# Patient Record
Sex: Female | Born: 1955 | Race: White | Hispanic: No | Marital: Married | Smoking: Never smoker
Health system: Southern US, Community
[De-identification: ages and names within clinical notes are randomized; demographics above are authoritative.]

## PROBLEM LIST (undated history)

## (undated) DIAGNOSIS — T8859XA Other complications of anesthesia, initial encounter: Secondary | ICD-10-CM

## (undated) DIAGNOSIS — M1990 Unspecified osteoarthritis, unspecified site: Secondary | ICD-10-CM

## (undated) DIAGNOSIS — Z8489 Family history of other specified conditions: Secondary | ICD-10-CM

## (undated) DIAGNOSIS — T4145XA Adverse effect of unspecified anesthetic, initial encounter: Secondary | ICD-10-CM

## (undated) DIAGNOSIS — Z9889 Other specified postprocedural states: Secondary | ICD-10-CM

## (undated) DIAGNOSIS — R112 Nausea with vomiting, unspecified: Secondary | ICD-10-CM

## (undated) HISTORY — PX: BREAST SURGERY: SHX581

## (undated) HISTORY — PX: EYE SURGERY: SHX253

## (undated) HISTORY — PX: OTHER SURGICAL HISTORY: SHX169

---

## 1999-05-07 ENCOUNTER — Encounter: Admission: RE | Admit: 1999-05-07 | Discharge: 1999-05-07 | Payer: Self-pay | Admitting: Gynecology

## 1999-05-07 ENCOUNTER — Encounter: Payer: Self-pay | Admitting: Gynecology

## 1999-07-16 ENCOUNTER — Ambulatory Visit (HOSPITAL_COMMUNITY): Admission: RE | Admit: 1999-07-16 | Discharge: 1999-07-16 | Payer: Self-pay | Admitting: Orthopaedic Surgery

## 1999-07-16 ENCOUNTER — Encounter: Payer: Self-pay | Admitting: Orthopaedic Surgery

## 2000-10-26 ENCOUNTER — Other Ambulatory Visit: Admission: RE | Admit: 2000-10-26 | Discharge: 2000-10-26 | Payer: Self-pay | Admitting: Gynecology

## 2000-11-03 ENCOUNTER — Encounter: Admission: RE | Admit: 2000-11-03 | Discharge: 2000-11-03 | Payer: Self-pay | Admitting: Gynecology

## 2000-11-03 ENCOUNTER — Encounter: Payer: Self-pay | Admitting: Gynecology

## 2001-10-16 ENCOUNTER — Ambulatory Visit (HOSPITAL_BASED_OUTPATIENT_CLINIC_OR_DEPARTMENT_OTHER): Admission: RE | Admit: 2001-10-16 | Discharge: 2001-10-16 | Payer: Self-pay | Admitting: Otolaryngology

## 2001-10-16 ENCOUNTER — Encounter (INDEPENDENT_AMBULATORY_CARE_PROVIDER_SITE_OTHER): Payer: Self-pay | Admitting: Specialist

## 2001-10-31 ENCOUNTER — Other Ambulatory Visit: Admission: RE | Admit: 2001-10-31 | Discharge: 2001-10-31 | Payer: Self-pay | Admitting: Gynecology

## 2001-11-27 ENCOUNTER — Encounter: Admission: RE | Admit: 2001-11-27 | Discharge: 2001-11-27 | Payer: Self-pay | Admitting: Gynecology

## 2001-11-27 ENCOUNTER — Encounter: Payer: Self-pay | Admitting: Gynecology

## 2001-12-19 ENCOUNTER — Other Ambulatory Visit: Admission: RE | Admit: 2001-12-19 | Discharge: 2001-12-19 | Payer: Self-pay | Admitting: Gynecology

## 2002-08-29 ENCOUNTER — Other Ambulatory Visit: Admission: RE | Admit: 2002-08-29 | Discharge: 2002-08-29 | Payer: Self-pay | Admitting: Gynecology

## 2003-03-25 ENCOUNTER — Encounter: Admission: RE | Admit: 2003-03-25 | Discharge: 2003-03-25 | Payer: Self-pay | Admitting: Gynecology

## 2003-04-02 ENCOUNTER — Other Ambulatory Visit: Admission: RE | Admit: 2003-04-02 | Discharge: 2003-04-02 | Payer: Self-pay | Admitting: Gynecology

## 2003-10-07 ENCOUNTER — Other Ambulatory Visit: Admission: RE | Admit: 2003-10-07 | Discharge: 2003-10-07 | Payer: Self-pay | Admitting: Gynecology

## 2004-09-25 ENCOUNTER — Encounter: Admission: RE | Admit: 2004-09-25 | Discharge: 2004-09-25 | Payer: Self-pay | Admitting: Gynecology

## 2004-11-25 ENCOUNTER — Other Ambulatory Visit: Admission: RE | Admit: 2004-11-25 | Discharge: 2004-11-25 | Payer: Self-pay | Admitting: Gynecology

## 2005-11-02 ENCOUNTER — Encounter: Admission: RE | Admit: 2005-11-02 | Discharge: 2005-11-02 | Payer: Self-pay | Admitting: Gynecology

## 2005-12-13 ENCOUNTER — Other Ambulatory Visit: Admission: RE | Admit: 2005-12-13 | Discharge: 2005-12-13 | Payer: Self-pay | Admitting: Gynecology

## 2006-12-14 ENCOUNTER — Encounter: Admission: RE | Admit: 2006-12-14 | Discharge: 2006-12-14 | Payer: Self-pay | Admitting: Gynecology

## 2006-12-20 ENCOUNTER — Other Ambulatory Visit: Admission: RE | Admit: 2006-12-20 | Discharge: 2006-12-20 | Payer: Self-pay | Admitting: Gynecology

## 2008-01-12 ENCOUNTER — Encounter: Admission: RE | Admit: 2008-01-12 | Discharge: 2008-01-12 | Payer: Self-pay | Admitting: Gynecology

## 2009-11-05 ENCOUNTER — Encounter: Admission: RE | Admit: 2009-11-05 | Discharge: 2009-11-05 | Payer: Self-pay | Admitting: Gynecology

## 2010-07-20 ENCOUNTER — Other Ambulatory Visit: Payer: Self-pay | Admitting: Gastroenterology

## 2010-10-02 NOTE — Op Note (Signed)
[place]  Patient:    Jasmin Randolph, Jasmin Randolph Visit Number: [id number] MRN: [id number]          Service Type: DSU Location: [facility] Attending Physician:  Carlean Purl Dictated by:   Kristine Garbe Ezzard Standing, M.D. Proc. Date: 10/16/01 Admit Date:  10/16/2001                             Operative Report  PREOPERATIVE DIAGNOSIS:  Chronic left ear canal ulceration.  POSTOPERATIVE DIAGNOSIS:  Chronic left ear canal ulceration.  OPERATION PERFORMED:  Excision of left ear canal ulceration.  SURGEON:  Kristine Garbe. Ezzard Standing, M.D.  ANESTHESIA:  Local 1% Xylocaine with 1:100,000 epinephrine.  COMPLICATIONS:  None.  INDICATIONS FOR PROCEDURE:  The patient is a 55 year old female who has had a chronic left ear canal ulcer, now for about two months that has been nonhealing.   She has been treated with several different antibiotics as well as steroid creams as well as antifungal medications.  Had a persistent sore, left ulcer measures approximately 1 cm in size.  She is taken to the operating room at this time for excision of left ear canal ulcer.  DESCRIPTION OF PROCEDURE:  The ear canal was injected with Xylocaine with epinephrine for local anesthetic.  Betadine was placed for antibacterial.  The ulcer was then elliptically excised and sent to pathology in saline as a fresh specimen.  Hemostasis from the excision site was obtained with silver nitrate. Bacitracin ointment was applied.  The patient was subsequently discharged home.  DISPOSITION:  [name] is discharged home later this morning, instructed to apply antibiotic ointment to the excision site daily.  Will have her follow up in my office in 10 to 14 days for recheck.  She will call my office in two to three days for results of the pathology. Dictated by:   Kristine Garbe Ezzard Standing, M.D. Attending Physician:  Carlean Purl DD:  10/16/01 TD:  10/17/01 Job: [id number]

## 2010-12-24 ENCOUNTER — Other Ambulatory Visit: Payer: Self-pay | Admitting: Gynecology

## 2010-12-24 DIAGNOSIS — Z1231 Encounter for screening mammogram for malignant neoplasm of breast: Secondary | ICD-10-CM

## 2011-01-11 ENCOUNTER — Ambulatory Visit
Admission: RE | Admit: 2011-01-11 | Discharge: 2011-01-11 | Disposition: A | Discharge status: Home / Self Care | Payer: 59 | Source: Ambulatory Visit | Attending: Gynecology | Admitting: Gynecology

## 2011-01-11 DIAGNOSIS — Z1231 Encounter for screening mammogram for malignant neoplasm of breast: Secondary | ICD-10-CM

## 2011-01-14 ENCOUNTER — Other Ambulatory Visit: Payer: Self-pay | Admitting: Gynecology

## 2011-01-14 ENCOUNTER — Ambulatory Visit: Payer: Self-pay

## 2011-01-14 DIAGNOSIS — R928 Other abnormal and inconclusive findings on diagnostic imaging of breast: Secondary | ICD-10-CM

## 2011-01-21 ENCOUNTER — Other Ambulatory Visit: Payer: 59

## 2011-01-22 ENCOUNTER — Ambulatory Visit
Admission: RE | Admit: 2011-01-22 | Discharge: 2011-01-22 | Disposition: A | Discharge status: Home / Self Care | Payer: 59 | Source: Ambulatory Visit | Attending: Gynecology | Admitting: Gynecology

## 2011-01-22 DIAGNOSIS — R928 Other abnormal and inconclusive findings on diagnostic imaging of breast: Secondary | ICD-10-CM

## 2011-01-26 ENCOUNTER — Other Ambulatory Visit: Payer: 59

## 2011-05-13 ENCOUNTER — Other Ambulatory Visit: Payer: Self-pay | Admitting: Gynecology

## 2011-12-20 ENCOUNTER — Other Ambulatory Visit: Payer: Self-pay | Admitting: Gynecology

## 2011-12-20 DIAGNOSIS — Z1231 Encounter for screening mammogram for malignant neoplasm of breast: Secondary | ICD-10-CM

## 2011-12-20 DIAGNOSIS — N63 Unspecified lump in unspecified breast: Secondary | ICD-10-CM

## 2011-12-20 DIAGNOSIS — N644 Mastodynia: Secondary | ICD-10-CM

## 2011-12-30 ENCOUNTER — Ambulatory Visit: Payer: 59

## 2011-12-30 ENCOUNTER — Ambulatory Visit
Admission: RE | Admit: 2011-12-30 | Discharge: 2011-12-30 | Disposition: A | Discharge status: Home / Self Care | Payer: BC Managed Care – PPO | Source: Ambulatory Visit | Attending: Gynecology | Admitting: Gynecology

## 2011-12-30 DIAGNOSIS — N63 Unspecified lump in unspecified breast: Secondary | ICD-10-CM

## 2011-12-30 DIAGNOSIS — N644 Mastodynia: Secondary | ICD-10-CM

## 2012-10-02 ENCOUNTER — Other Ambulatory Visit: Payer: Self-pay | Admitting: Gynecology

## 2012-10-02 DIAGNOSIS — Z1231 Encounter for screening mammogram for malignant neoplasm of breast: Secondary | ICD-10-CM

## 2012-10-02 DIAGNOSIS — N959 Unspecified menopausal and perimenopausal disorder: Secondary | ICD-10-CM

## 2013-01-05 ENCOUNTER — Ambulatory Visit
Admission: RE | Admit: 2013-01-05 | Discharge: 2013-01-05 | Disposition: A | Discharge status: Home / Self Care | Payer: BC Managed Care – PPO | Source: Ambulatory Visit | Attending: Gynecology | Admitting: Gynecology

## 2013-01-05 DIAGNOSIS — Z1231 Encounter for screening mammogram for malignant neoplasm of breast: Secondary | ICD-10-CM

## 2013-01-05 DIAGNOSIS — N959 Unspecified menopausal and perimenopausal disorder: Secondary | ICD-10-CM

## 2014-01-28 ENCOUNTER — Other Ambulatory Visit: Payer: Self-pay

## 2014-01-28 DIAGNOSIS — Z1231 Encounter for screening mammogram for malignant neoplasm of breast: Secondary | ICD-10-CM

## 2014-02-22 ENCOUNTER — Ambulatory Visit
Admission: RE | Admit: 2014-02-22 | Discharge: 2014-02-22 | Disposition: A | Discharge status: Home / Self Care | Payer: BC Managed Care – PPO | Source: Ambulatory Visit

## 2014-02-22 DIAGNOSIS — Z1231 Encounter for screening mammogram for malignant neoplasm of breast: Secondary | ICD-10-CM

## 2014-07-22 IMAGING — MG MM DIGITAL SCREENING BILATERAL
9 of 12 series · 9 of 28 positions shown · non-contrast
Comparison: Previous exam(s).

CLINICAL DATA: Screening.

EXAM:
DIGITAL SCREENING BILATERAL MAMMOGRAM WITH CAD
DIGITAL BREAST TOMOSYNTHESIS
Digital breast tomosynthesis images are acquired in two projections.
These images are reviewed in combination with the digital mammogram,
confirming the findings below.

[L MLO]
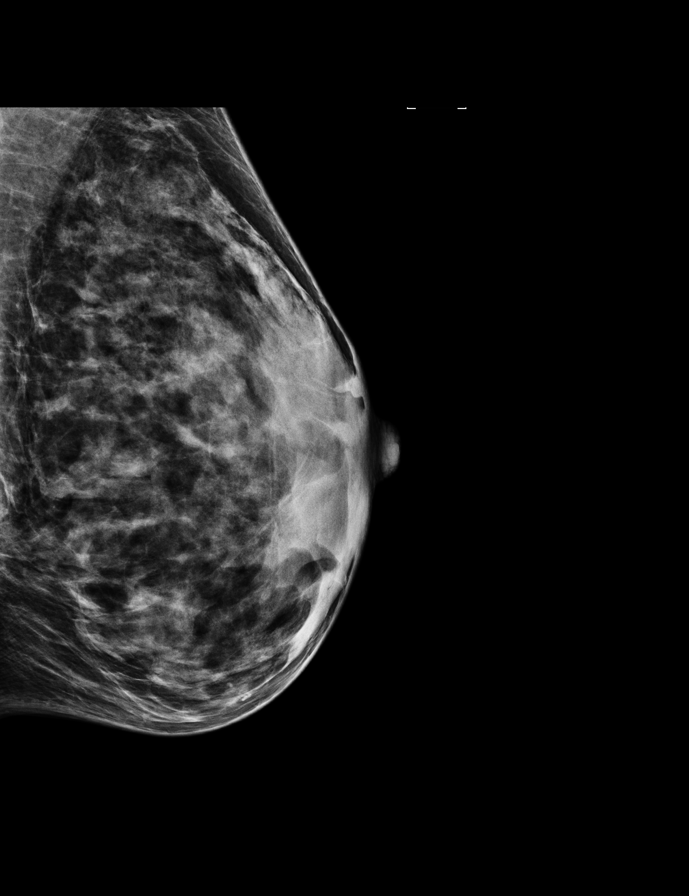

[R CC]
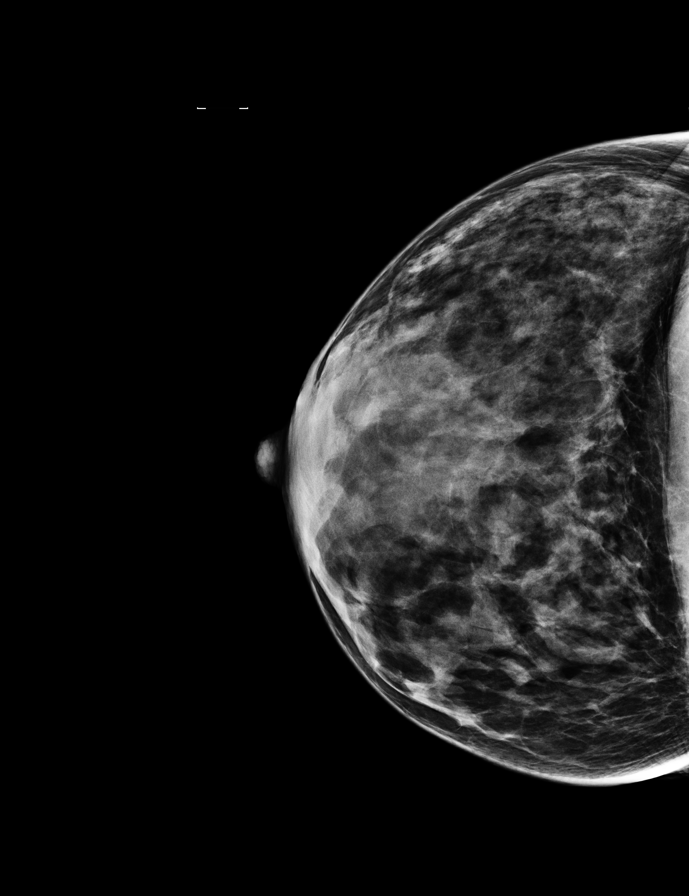

[R MLO]
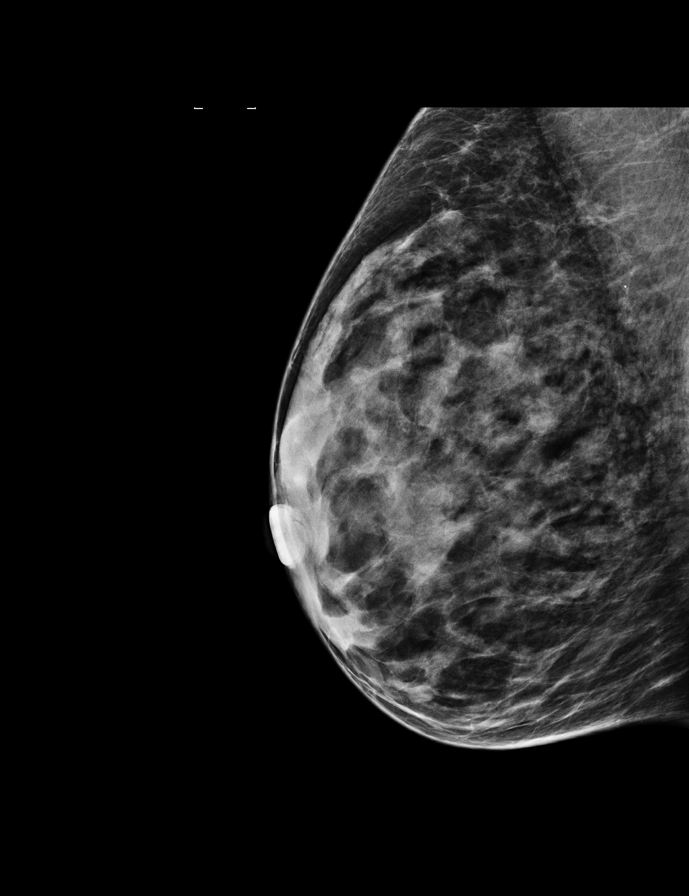

[L CC]
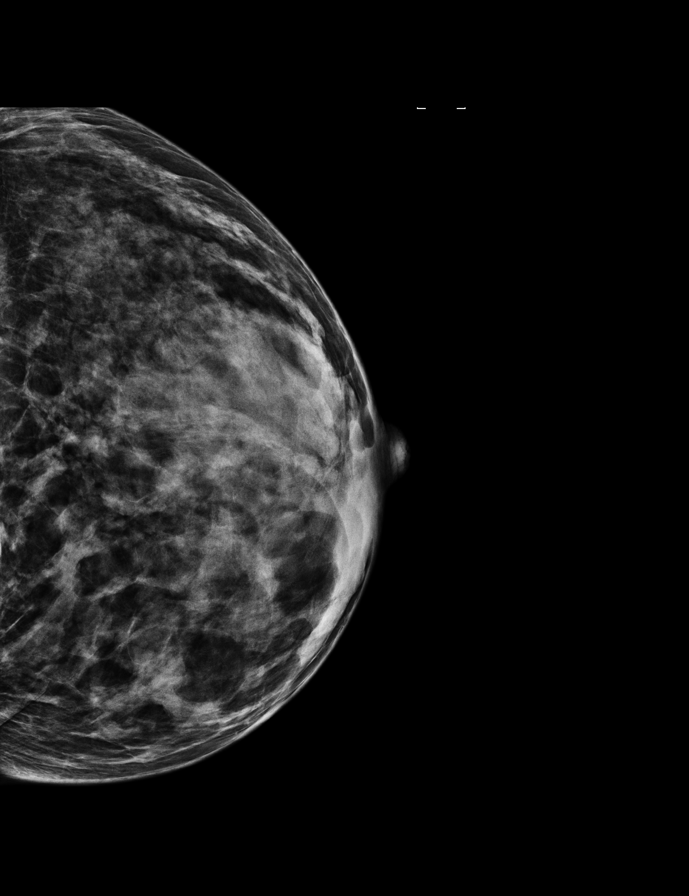

[R MLO tomo · tomo slice 19/36.0]
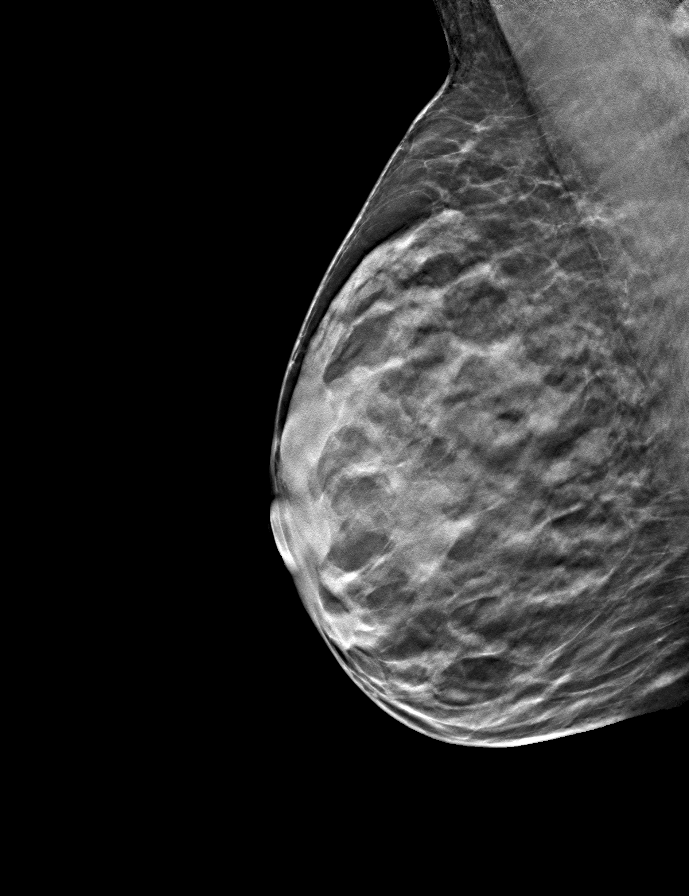

[L CC synth-2D]
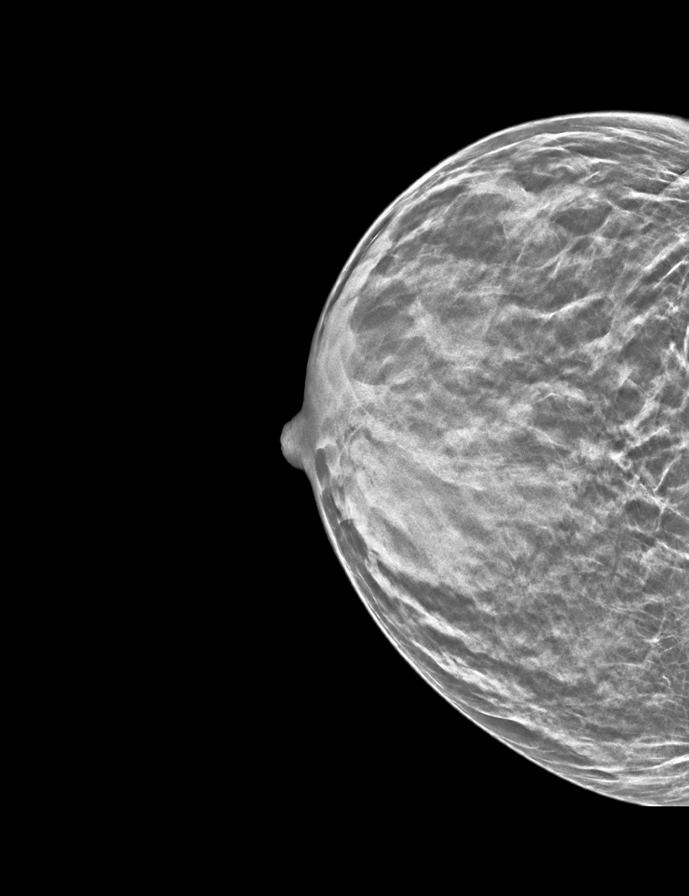

[L MLO synth-2D]
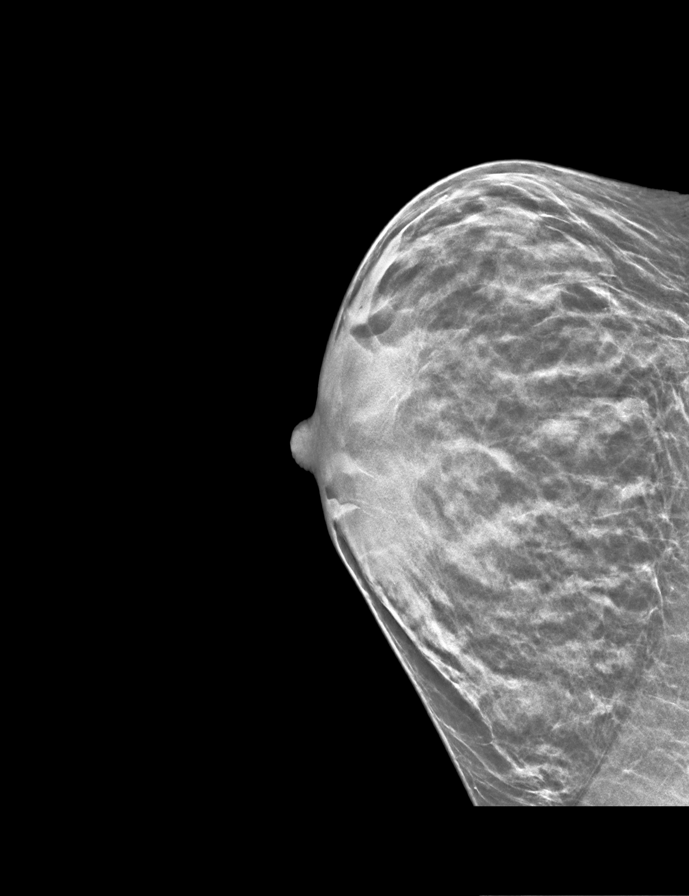

[R MLO synth-2D]
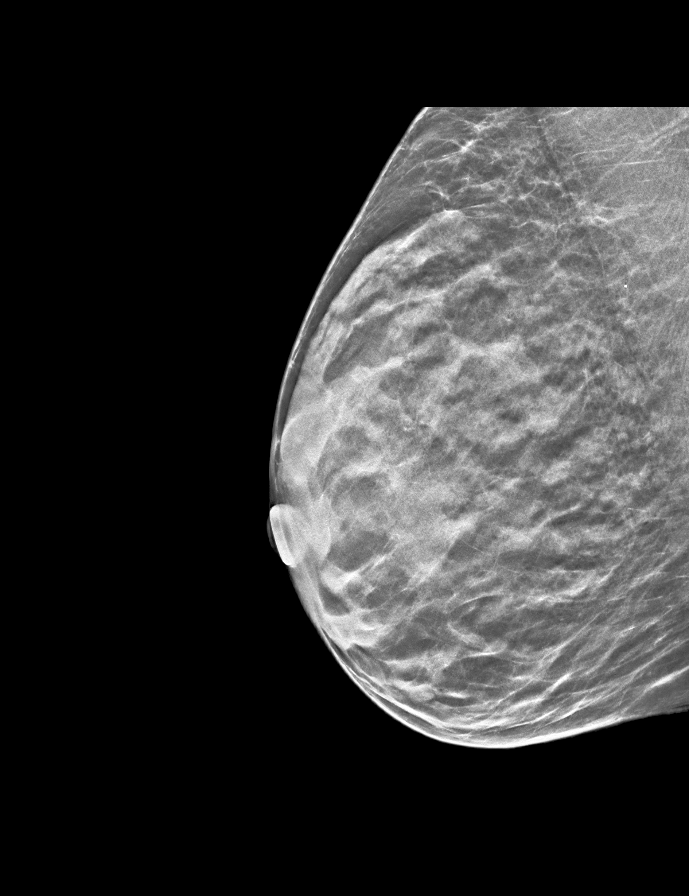

[R CC synth-2D]
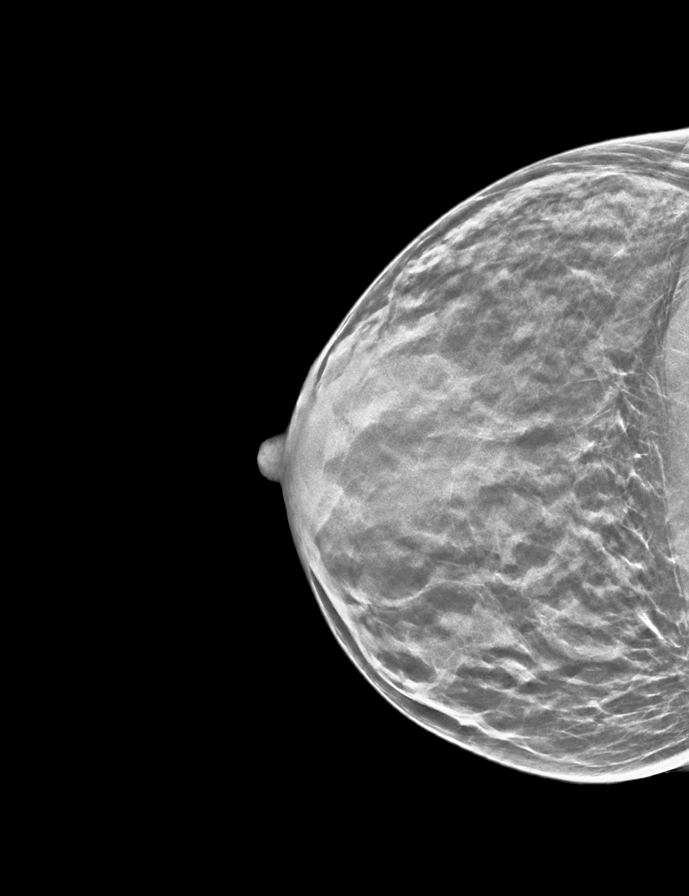

[9 of 28 positions shown; findings below may reference images not displayed]

ACR Breast Density Category c: The breasts are heterogeneously
dense, which may obscure small masses.
FINDINGS: There are no findings suspicious for malignancy. Images were
processed with CAD.
IMPRESSION: No mammographic evidence of malignancy. A result letter of this
screening mammogram will be mailed directly to the patient.

BI-RADS CATEGORY  1. Negative

RECOMMENDATION:
Screening mammogram in one year. (Code:[id number])

## 2016-07-08 ENCOUNTER — Ambulatory Visit: Payer: Self-pay | Admitting: General Surgery

## 2016-07-23 NOTE — Patient Instructions (Addendum)
Jasmin KanarisSarah L Randolph  07/23/2016   Your procedure is scheduled on: Tuesday 07/27/2016  [facility]  Entrance [place]  elevators to 3rd floor to  Short Stay Center at  1130  AM.  Call this number if you have problems the morning of surgery [phone]   Remember: ONLY 1 PERSON MAY GO WITH YOU TO SHORT STAY TO GET  READY MORNING OF YOUR SURGERY.   Do not eat food  :After Midnight.   MAY HAVE CLEAR LIQUIDS FROM MIDNIGHT UP UNTIL 0730 AM THEN NOTHING AFTER 0730 AM UNTIL AFTER SURGERY!     CLEAR LIQUID DIET   Foods Allowed                                                                     Foods Excluded  Coffee and tea, regular and decaf                             liquids that you cannot  Plain Jell-O in any flavor                                             see through such as: Fruit ices (not with fruit pulp)                                     milk, soups, orange juice  Iced Popsicles                                    All solid food Carbonated beverages, regular and diet                                    Cranberry, grape and apple juices Sports drinks like Gatorade Lightly seasoned clear broth or consume(fat free) Sugar, honey syrup  Sample Menu Breakfast                                Lunch                                     Supper Cranberry juice                    Beef broth                            Chicken broth Jell-O                                     Grape  juice                           Apple juice Coffee or tea                        Jell-O                                      Popsicle                                                Coffee or tea                        Coffee or tea  _____________________________________________________________________     Take these medicines the morning of surgery with A SIP OF WATER: none                                You may not have any metal on your body including hair pins and               piercings  Do not wear jewelry, make-up, lotions, powders or perfumes, deodorant             Do not wear nail polish.  Do not shave  48 hours prior to surgery.              Men may shave face and neck.   Do not bring valuables to the hospital. [facility] IS NOT             RESPONSIBLE   FOR VALUABLES.  Contacts, dentures or bridgework may not be worn into surgery.  Leave suitcase in the car. After surgery it may be brought to your room.                  Please read over the following fact sheets you were given: _____________________________________________________________________             [facility] - Preparing for Surgery Before surgery, you can play an important role.  Because skin is not sterile, your skin needs to be as free of germs as possible.  You can reduce the number of germs on your skin by washing with CHG (chlorahexidine gluconate) soap before surgery.  CHG is an antiseptic cleaner which kills germs and bonds with the skin to continue killing germs even after washing. Please DO NOT use if you have an allergy to CHG or antibacterial soaps.  If your skin becomes reddened/irritated stop using the CHG and inform your nurse when you arrive at Short Stay. Do not shave (including legs and underarms) for at least 48 hours prior to the first CHG shower.  You may shave your face/neck. Please follow these instructions carefully:  1.  Shower with CHG Soap the night before surgery and the  morning of Surgery.  2.  If you choose to wash your hair, wash your hair first as usual with your  normal  shampoo.  3.  After you shampoo, rinse your hair and body thoroughly to remove the  shampoo.  4.  Use CHG as you would any other liquid soap.  You can apply chg directly  to the skin and wash                       Gently with a scrungie or clean washcloth.  5.  Apply the CHG Soap to your body ONLY FROM THE NECK DOWN.   Do not use on face/ open                            Wound or open sores. Avoid contact with eyes, ears mouth and genitals (private parts).                       Wash face,  Genitals (private parts) with your normal soap.             6.  Wash thoroughly, paying special attention to the area where your surgery  will be performed.  7.  Thoroughly rinse your body with warm water from the neck down.  8.  DO NOT shower/wash with your normal soap after using and rinsing off  the CHG Soap.                9.  Pat yourself dry with a clean towel.            10.  Wear clean pajamas.            11.  Place clean sheets on your bed the night of your first shower and do not  sleep with pets. Day of Surgery : Do not apply any lotions/deodorants the morning of surgery.  Please wear clean clothes to the hospital/surgery center.  FAILURE TO FOLLOW THESE INSTRUCTIONS MAY RESULT IN THE CANCELLATION OF YOUR SURGERY PATIENT SIGNATURE_________________________________  NURSE SIGNATURE__________________________________  ________________________________________________________________________

## 2016-07-26 ENCOUNTER — Encounter (HOSPITAL_COMMUNITY)
Admission: RE | Admit: 2016-07-26 | Discharge: 2016-07-26 | Disposition: A | Discharge status: Home / Self Care | Payer: BC Managed Care – PPO | Source: Ambulatory Visit | Attending: General Surgery | Admitting: General Surgery

## 2016-07-26 ENCOUNTER — Encounter (INDEPENDENT_AMBULATORY_CARE_PROVIDER_SITE_OTHER): Payer: Self-pay

## 2016-07-26 ENCOUNTER — Encounter (HOSPITAL_COMMUNITY): Payer: Self-pay

## 2016-07-26 HISTORY — DX: Family history of other specified conditions: Z84.89

## 2016-07-26 HISTORY — DX: Unspecified osteoarthritis, unspecified site: M19.90

## 2016-07-26 HISTORY — DX: Nausea with vomiting, unspecified: R11.2

## 2016-07-26 HISTORY — DX: Nausea with vomiting, unspecified: Z98.890

## 2016-07-26 HISTORY — DX: Other complications of anesthesia, initial encounter: T88.59XA

## 2016-07-26 HISTORY — DX: Adverse effect of unspecified anesthetic, initial encounter: T41.45XA

## 2016-07-26 LAB — CBC
HEMATOCRIT: 38.1 % (ref 36.0–46.0)
Hemoglobin: 12.9 g/dL (ref 12.0–15.0)
MCH: 30.4 pg (ref 26.0–34.0)
MCHC: 33.9 g/dL (ref 30.0–36.0)
MCV: 89.6 fL (ref 78.0–100.0)
Platelets: 189 10*3/uL (ref 150–400)
RBC: 4.25 MIL/uL (ref 3.87–5.11)
RDW: 13.2 % (ref 11.5–15.5)
WBC: 4.8 10*3/uL (ref 4.0–10.5)

## 2016-07-26 LAB — ABO/RH: ABO/RH(D): O NEG

## 2016-07-27 ENCOUNTER — Inpatient Hospital Stay (HOSPITAL_COMMUNITY)
Admission: RE | Admit: 2016-07-27 | Discharge: 2016-07-29 | DRG: 801 | Disposition: A | Discharge status: Home / Self Care | Payer: BC Managed Care – PPO | Source: Ambulatory Visit | Attending: General Surgery | Admitting: General Surgery

## 2016-07-27 ENCOUNTER — Inpatient Hospital Stay (HOSPITAL_COMMUNITY): Payer: BC Managed Care – PPO | Admitting: Certified Registered Nurse Anesthetist

## 2016-07-27 ENCOUNTER — Encounter (HOSPITAL_COMMUNITY): Payer: Self-pay | Admitting: *Deleted

## 2016-07-27 ENCOUNTER — Encounter (HOSPITAL_COMMUNITY)
Admission: RE | Disposition: A | Discharge status: Home / Self Care | Payer: Self-pay | Source: Ambulatory Visit | Attending: General Surgery

## 2016-07-27 DIAGNOSIS — K660 Peritoneal adhesions (postprocedural) (postinfection): Secondary | ICD-10-CM | POA: Diagnosis present

## 2016-07-27 DIAGNOSIS — D734 Cyst of spleen: Principal | ICD-10-CM | POA: Diagnosis present

## 2016-07-27 DIAGNOSIS — Z86010 Personal history of colonic polyps: Secondary | ICD-10-CM | POA: Diagnosis not present

## 2016-07-27 HISTORY — PX: LAPAROSCOPIC SPLENECTOMY: SHX409

## 2016-07-27 LAB — TYPE AND SCREEN
ABO/RH(D): O NEG
Antibody Screen: NEGATIVE

## 2016-07-27 SURGERY — SPLENECTOMY, LAPAROSCOPIC
Anesthesia: General | Site: Abdomen

## 2016-07-27 MED ORDER — FENTANYL CITRATE (PF) 100 MCG/2ML IJ SOLN
INTRAMUSCULAR | Status: DC | PRN
  Administered 2016-07-27 (×5): 50 ug via INTRAVENOUS

## 2016-07-27 MED ORDER — BUPIVACAINE-EPINEPHRINE 0.25% -1:200000 IJ SOLN
INTRAMUSCULAR | Status: AC
  Filled 2016-07-27: qty 1

## 2016-07-27 MED ORDER — BUPIVACAINE LIPOSOME 1.3 % IJ SUSP
20.0000 mL | Freq: Once | INTRAMUSCULAR | Status: DC
  Filled 2016-07-27: qty 20

## 2016-07-27 MED ORDER — BUPIVACAINE-EPINEPHRINE 0.25% -1:200000 IJ SOLN
INTRAMUSCULAR | Status: DC | PRN
  Administered 2016-07-27: 38 mL

## 2016-07-27 MED ORDER — OXYCODONE-ACETAMINOPHEN 5-325 MG PO TABS
1.0000 | ORAL_TABLET | ORAL | Status: DC | PRN
  Administered 2016-07-28 – 2016-07-29 (×3): 1 via ORAL
  Filled 2016-07-27 (×3): qty 1

## 2016-07-27 MED ORDER — ROCURONIUM BROMIDE 50 MG/5ML IV SOSY
PREFILLED_SYRINGE | INTRAVENOUS | Status: AC
  Filled 2016-07-27: qty 5

## 2016-07-27 MED ORDER — CEFAZOLIN SODIUM-DEXTROSE 2-4 GM/100ML-% IV SOLN
2.0000 g | INTRAVENOUS | Status: AC
  Administered 2016-07-27 (×2): 2 g via INTRAVENOUS
  Filled 2016-07-27: qty 100

## 2016-07-27 MED ORDER — CEFAZOLIN SODIUM-DEXTROSE 2-4 GM/100ML-% IV SOLN
INTRAVENOUS | Status: AC
  Filled 2016-07-27: qty 100

## 2016-07-27 MED ORDER — MIDAZOLAM HCL 5 MG/5ML IJ SOLN
INTRAMUSCULAR | Status: DC | PRN
  Administered 2016-07-27 (×2): 1 mg via INTRAVENOUS

## 2016-07-27 MED ORDER — PANTOPRAZOLE SODIUM 40 MG IV SOLR
40.0000 mg | Freq: Every day | INTRAVENOUS | Status: DC
  Administered 2016-07-27: 40 mg via INTRAVENOUS
  Filled 2016-07-27: qty 40

## 2016-07-27 MED ORDER — ONDANSETRON HCL 4 MG/2ML IJ SOLN
INTRAMUSCULAR | Status: AC
  Filled 2016-07-27: qty 2

## 2016-07-27 MED ORDER — ONDANSETRON 4 MG PO TBDP: 4.0000 mg | ORAL_TABLET | Freq: Four times a day (QID) | ORAL | Status: DC | PRN

## 2016-07-27 MED ORDER — ACETAMINOPHEN 500 MG PO TABS
1000.0000 mg | ORAL_TABLET | ORAL | Status: AC
  Administered 2016-07-27: 1000 mg via ORAL
  Filled 2016-07-27: qty 2

## 2016-07-27 MED ORDER — 0.9 % SODIUM CHLORIDE (POUR BTL) OPTIME
TOPICAL | Status: DC | PRN
  Administered 2016-07-27: 1000 mL

## 2016-07-27 MED ORDER — MEPERIDINE HCL 50 MG/ML IJ SOLN: 6.2500 mg | INTRAMUSCULAR | Status: DC | PRN

## 2016-07-27 MED ORDER — PROPOFOL 10 MG/ML IV BOLUS
INTRAVENOUS | Status: AC
  Filled 2016-07-27: qty 20

## 2016-07-27 MED ORDER — LACTATED RINGERS IV SOLN
INTRAVENOUS | Status: DC
  Administered 2016-07-27 (×3): via INTRAVENOUS

## 2016-07-27 MED ORDER — DEXAMETHASONE SODIUM PHOSPHATE 10 MG/ML IJ SOLN
INTRAMUSCULAR | Status: DC | PRN
  Administered 2016-07-27: 10 mg via INTRAVENOUS

## 2016-07-27 MED ORDER — PROMETHAZINE HCL 25 MG/ML IJ SOLN: 6.2500 mg | INTRAMUSCULAR | Status: DC | PRN

## 2016-07-27 MED ORDER — ONDANSETRON HCL 4 MG/2ML IJ SOLN: 4.0000 mg | Freq: Four times a day (QID) | INTRAMUSCULAR | Status: DC | PRN

## 2016-07-27 MED ORDER — ROCURONIUM BROMIDE 50 MG/5ML IV SOSY
PREFILLED_SYRINGE | INTRAVENOUS | Status: AC
  Filled 2016-07-27: qty 15

## 2016-07-27 MED ORDER — SUGAMMADEX SODIUM 200 MG/2ML IV SOLN
INTRAVENOUS | Status: DC | PRN
  Administered 2016-07-27: 200 mg via INTRAVENOUS

## 2016-07-27 MED ORDER — CELECOXIB 200 MG PO CAPS
400.0000 mg | ORAL_CAPSULE | ORAL | Status: AC
Start: 2016-07-27 — End: 2016-07-27
  Administered 2016-07-27: 400 mg via ORAL
  Filled 2016-07-27: qty 2

## 2016-07-27 MED ORDER — HYDROMORPHONE HCL 2 MG/ML IJ SOLN
INTRAMUSCULAR | Status: AC
  Filled 2016-07-27: qty 1

## 2016-07-27 MED ORDER — CHLORHEXIDINE GLUCONATE CLOTH 2 % EX PADS: 6.0000 | MEDICATED_PAD | Freq: Once | CUTANEOUS | Status: DC

## 2016-07-27 MED ORDER — FENTANYL CITRATE (PF) 250 MCG/5ML IJ SOLN
INTRAMUSCULAR | Status: AC
  Filled 2016-07-27: qty 5

## 2016-07-27 MED ORDER — ONDANSETRON HCL 4 MG/2ML IJ SOLN
INTRAMUSCULAR | Status: DC | PRN
  Administered 2016-07-27: 4 mg via INTRAVENOUS

## 2016-07-27 MED ORDER — SCOPOLAMINE 1 MG/3DAYS TD PT72
MEDICATED_PATCH | TRANSDERMAL | Status: AC
  Filled 2016-07-27: qty 1

## 2016-07-27 MED ORDER — MIDAZOLAM HCL 2 MG/2ML IJ SOLN
INTRAMUSCULAR | Status: AC
  Filled 2016-07-27: qty 2

## 2016-07-27 MED ORDER — PROPOFOL 10 MG/ML IV BOLUS
INTRAVENOUS | Status: DC | PRN
  Administered 2016-07-27: 150 mg via INTRAVENOUS
  Administered 2016-07-27: 50 mg via INTRAVENOUS

## 2016-07-27 MED ORDER — MORPHINE SULFATE (PF) 4 MG/ML IV SOLN: 2.0000 mg | INTRAVENOUS | Status: DC | PRN

## 2016-07-27 MED ORDER — EVICEL 5 ML EX KIT
PACK | Freq: Once | CUTANEOUS | Status: DC
  Filled 2016-07-27: qty 1

## 2016-07-27 MED ORDER — LIDOCAINE 2% (20 MG/ML) 5 ML SYRINGE
INTRAMUSCULAR | Status: AC
  Filled 2016-07-27: qty 5

## 2016-07-27 MED ORDER — KCL IN DEXTROSE-NACL 20-5-0.9 MEQ/L-%-% IV SOLN
INTRAVENOUS | Status: DC
  Administered 2016-07-27 – 2016-07-28 (×2): via INTRAVENOUS
  Filled 2016-07-27 (×5): qty 1000

## 2016-07-27 MED ORDER — ENOXAPARIN SODIUM 40 MG/0.4ML ~~LOC~~ SOLN
40.0000 mg | SUBCUTANEOUS | Status: DC
  Administered 2016-07-28: 40 mg via SUBCUTANEOUS
  Filled 2016-07-27: qty 0.4

## 2016-07-27 MED ORDER — MIDAZOLAM HCL 2 MG/2ML IJ SOLN: 0.5000 mg | Freq: Once | INTRAMUSCULAR | Status: DC | PRN

## 2016-07-27 MED ORDER — LIDOCAINE 2% (20 MG/ML) 5 ML SYRINGE
INTRAMUSCULAR | Status: DC | PRN
  Administered 2016-07-27: 50 mg via INTRAVENOUS

## 2016-07-27 MED ORDER — GABAPENTIN 300 MG PO CAPS
300.0000 mg | ORAL_CAPSULE | ORAL | Status: AC
  Administered 2016-07-27: 300 mg via ORAL
  Filled 2016-07-27: qty 1

## 2016-07-27 MED ORDER — LACTATED RINGERS IR SOLN
Status: DC | PRN
  Administered 2016-07-27: 1000 mL

## 2016-07-27 MED ORDER — HYDROMORPHONE HCL 1 MG/ML IJ SOLN
0.2500 mg | INTRAMUSCULAR | Status: DC | PRN
  Administered 2016-07-27 (×2): 0.5 mg via INTRAVENOUS

## 2016-07-27 MED ORDER — HYDROMORPHONE HCL 1 MG/ML IJ SOLN
INTRAMUSCULAR | Status: AC
  Administered 2016-07-27: 0.5 mg via INTRAVENOUS
  Filled 2016-07-27: qty 1

## 2016-07-27 MED ORDER — ROCURONIUM BROMIDE 50 MG/5ML IV SOSY
PREFILLED_SYRINGE | INTRAVENOUS | Status: DC | PRN
  Administered 2016-07-27: 20 mg via INTRAVENOUS
  Administered 2016-07-27: 10 mg via INTRAVENOUS
  Administered 2016-07-27 (×3): 20 mg via INTRAVENOUS
  Administered 2016-07-27: 40 mg via INTRAVENOUS

## 2016-07-27 MED ORDER — SCOPOLAMINE 1 MG/3DAYS TD PT72
1.0000 | MEDICATED_PATCH | TRANSDERMAL | Status: DC
  Administered 2016-07-27: 1 via TRANSDERMAL

## 2016-07-27 MED ORDER — DEXAMETHASONE SODIUM PHOSPHATE 10 MG/ML IJ SOLN
INTRAMUSCULAR | Status: AC
  Filled 2016-07-27: qty 1

## 2016-07-27 SURGICAL SUPPLY — 85 items
ADH SKN CLS APL DERMABOND .7 (GAUZE/BANDAGES/DRESSINGS) ×1
APL SRG 32X5 SNPLK LF DISP (MISCELLANEOUS)
APPLIER CLIP ROT 13.4 12 LRG (CLIP)
APR CLP LRG 13.4X12 ROT 20 MLT (CLIP)
BAG LAPAROSCOPIC 12 15 PORT 16 (BASKET) IMPLANT
BAG RETRIEVAL 12/15 (BASKET)
BAG RETRIEVAL 12/15MM (BASKET)
BAG SPEC RTRVL LRG 6X4 10 (ENDOMECHANICALS) ×1
BIOPATCH WHT 1IN DISK W/4.0 H (GAUZE/BANDAGES/DRESSINGS) IMPLANT
BLADE SURG 15 STRL LF DISP TIS (BLADE) ×1 IMPLANT
BLADE SURG 15 STRL SS (BLADE) ×3
CHLORAPREP W/TINT 26ML (MISCELLANEOUS) ×3 IMPLANT
CLIP APPLIE ROT 13.4 12 LRG (CLIP) IMPLANT
CLIP SUT LAPRA TY ABSORB (SUTURE) IMPLANT
CLOSURE WOUND 1/2 X4 (GAUZE/BANDAGES/DRESSINGS)
COVER SURGICAL LIGHT HANDLE (MISCELLANEOUS) ×3 IMPLANT
DECANTER SPIKE VIAL GLASS SM (MISCELLANEOUS) ×3 IMPLANT
DERMABOND ADVANCED (GAUZE/BANDAGES/DRESSINGS) ×2
DERMABOND ADVANCED .7 DNX12 (GAUZE/BANDAGES/DRESSINGS) IMPLANT
DEVICE SUTURE ENDOST 10MM (ENDOMECHANICALS) IMPLANT
DISSECTOR BLUNT TIP ENDO 5MM (MISCELLANEOUS) IMPLANT
DRAIN CHANNEL 19F RND (DRAIN) ×2 IMPLANT
DRAIN PENROSE 18X1/4 LTX STRL (WOUND CARE) IMPLANT
DRSG PAD ABDOMINAL 8X10 ST (GAUZE/BANDAGES/DRESSINGS) IMPLANT
ELECT PENCIL ROCKER SW 15FT (MISCELLANEOUS) ×1 IMPLANT
ELECT REM PT RETURN 9FT ADLT (ELECTROSURGICAL) ×3
ELECTRODE REM PT RTRN 9FT ADLT (ELECTROSURGICAL) ×1 IMPLANT
EVACUATOR SILICONE 100CC (DRAIN) ×2 IMPLANT
GAUZE SPONGE 4X4 12PLY STRL (GAUZE/BANDAGES/DRESSINGS) ×3 IMPLANT
GAUZE SPONGE 4X4 16PLY XRAY LF (GAUZE/BANDAGES/DRESSINGS) ×1 IMPLANT
GLOVE BIOGEL PI IND STRL 7.5 (GLOVE) ×1 IMPLANT
GLOVE BIOGEL PI INDICATOR 7.5 (GLOVE) ×2
GLOVE ECLIPSE 7.5 STRL STRAW (GLOVE) ×3 IMPLANT
GOWN STRL REUS W/TWL XL LVL3 (GOWN DISPOSABLE) ×6 IMPLANT
HEMOSTAT ARISTA ABSORB 3G PWDR (MISCELLANEOUS) IMPLANT
HEMOSTAT SURGICEL 4X8 (HEMOSTASIS) IMPLANT
HOLDER FOLEY CATH W/STRAP (MISCELLANEOUS) ×2 IMPLANT
IRRIG SUCT STRYKERFLOW 2 WTIP (MISCELLANEOUS) ×3
IRRIGATION SUCT STRKRFLW 2 WTP (MISCELLANEOUS) ×1 IMPLANT
KIT BASIN OR (CUSTOM PROCEDURE TRAY) ×3 IMPLANT
NEEDLE HYPO 22GX1.5 SAFETY (NEEDLE) ×3 IMPLANT
PACK CARDIOVASCULAR III (CUSTOM PROCEDURE TRAY) ×3 IMPLANT
PAD POSITIONING PINK XL (MISCELLANEOUS) IMPLANT
POUCH SPECIMEN RETRIEVAL 10MM (ENDOMECHANICALS) ×2 IMPLANT
RELOAD ENDO STITCH 2.0 (ENDOMECHANICALS)
RELOAD STAPLE 60 2.6 WHT THN (STAPLE) IMPLANT
RELOAD STAPLE 60 3.6 BLU REG (STAPLE) IMPLANT
RELOAD STAPLER BLUE 60MM (STAPLE) IMPLANT
RELOAD STAPLER WHITE 60MM (STAPLE) IMPLANT
RELOAD SUT SNGL STCH ABSRB 2-0 (ENDOMECHANICALS) IMPLANT
RELOAD SUT SNGL STCH BLK 2-0 (ENDOMECHANICALS) IMPLANT
SCISSORS LAP 5X35 DISP (ENDOMECHANICALS) ×3 IMPLANT
SEALANT SURGICAL APPL DUAL CAN (MISCELLANEOUS) ×1 IMPLANT
SET IRRIG TUBING LAPAROSCOPIC (IRRIGATION / IRRIGATOR) ×2 IMPLANT
SHEARS HARMONIC ACE PLUS 36CM (ENDOMECHANICALS) ×3 IMPLANT
SLEEVE XCEL OPT CAN 5 100 (ENDOMECHANICALS) IMPLANT
SOLUTION ANTI FOG 6CC (MISCELLANEOUS) ×3 IMPLANT
SPONGE LAP 18X18 X RAY DECT (DISPOSABLE) IMPLANT
STAPLER ECHELON LONG 60 440 (INSTRUMENTS) IMPLANT
STAPLER RELOAD BLUE 60MM (STAPLE)
STAPLER RELOAD WHITE 60MM (STAPLE)
STAPLER VISISTAT 35W (STAPLE) ×1 IMPLANT
STRIP CLOSURE SKIN 1/2X4 (GAUZE/BANDAGES/DRESSINGS) IMPLANT
SUT ETHILON 3 0 PS 1 (SUTURE) ×3 IMPLANT
SUT MNCRL AB 4-0 PS2 18 (SUTURE) ×5 IMPLANT
SUT PDS AB 1 TP1 96 (SUTURE) IMPLANT
SUT RELOAD ENDO STITCH 2 48X1 (ENDOMECHANICALS)
SUT RELOAD ENDO STITCH 2.0 (ENDOMECHANICALS)
SUT VIC AB 4-0 SH 18 (SUTURE) ×1 IMPLANT
SUT VICRYL 0 UR6 27IN ABS (SUTURE) ×6 IMPLANT
SUTURE RELOAD END STTCH 2 48X1 (ENDOMECHANICALS) IMPLANT
SUTURE RELOAD ENDO STITCH 2.0 (ENDOMECHANICALS) IMPLANT
SYR 20CC LL (SYRINGE) ×1 IMPLANT
TIP RIGID 35CM EVICEL (HEMOSTASIS) IMPLANT
TOWEL OR 17X26 10 PK STRL BLUE (TOWEL DISPOSABLE) ×3 IMPLANT
TOWEL OR NON WOVEN STRL DISP B (DISPOSABLE) ×3 IMPLANT
TRAY FOLEY CATH 14FRSI W/METER (CATHETERS) ×2 IMPLANT
TRAY FOLEY W/METER SILVER 16FR (SET/KITS/TRAYS/PACK) ×1 IMPLANT
TROCAR ADV FIXATION 5X100MM (TROCAR) ×2 IMPLANT
TROCAR BLADELESS OPT 5 100 (ENDOMECHANICALS) ×3 IMPLANT
TROCAR XCEL 12X100 BLDLESS (ENDOMECHANICALS) IMPLANT
TROCAR XCEL BLUNT TIP 100MML (ENDOMECHANICALS) IMPLANT
TROCAR XCEL NON-BLD 11X100MML (ENDOMECHANICALS) IMPLANT
TUBING INSUF HEATED (TUBING) ×3 IMPLANT
YANKAUER SUCT BULB TIP 10FT TU (MISCELLANEOUS) ×1 IMPLANT

## 2016-07-27 NOTE — Anesthesia Preprocedure Evaluation (Signed)
Anesthesia Evaluation  Patient identified by MRN, date of birth, ID band Patient awake    Reviewed: Allergy & Precautions, NPO status , Patient's Chart, lab work & pertinent test results  History of Anesthesia Complications (+) PONV  Airway Mallampati: I  TM Distance: >3 FB Neck ROM: Full    Dental  (+) Dental Advisory Given, Teeth Intact   Pulmonary neg pulmonary ROS,    breath sounds clear to auscultation       Cardiovascular negative cardio ROS   Rhythm:Regular Rate:Normal     Neuro/Psych negative neurological ROS     GI/Hepatic negative GI ROS, Neg liver ROS,   Endo/Other  negative endocrine ROS  Renal/GU negative Renal ROS     Musculoskeletal negative musculoskeletal ROS (+)   Abdominal   Peds  Hematology negative hematology ROS (+)   Anesthesia Other Findings   Reproductive/Obstetrics                             Anesthesia Physical Anesthesia Plan  ASA: I  Anesthesia Plan: General   Post-op Pain Management:    Induction: Intravenous  Airway Management Planned: Oral ETT  Additional Equipment:   Intra-op Plan:   Post-operative Plan: Extubation in OR  Informed Consent: I have reviewed the patients History and Physical, chart, labs and discussed the procedure including the risks, benefits and alternatives for the proposed anesthesia with the patient or authorized representative who has indicated his/her understanding and acceptance.   Dental advisory given  Plan Discussed with: CRNA and Surgeon  Anesthesia Plan Comments: (Plan routine monitors, GETA)        Anesthesia Quick Evaluation

## 2016-07-27 NOTE — Anesthesia Procedure Notes (Signed)
Procedure Name: Intubation Date/Time: 07/27/2016 2:21 PM Performed by: West Pugh Pre-anesthesia Checklist: Patient identified, Emergency Drugs available, Suction available, Patient being monitored and Timeout performed Patient Re-evaluated:Patient Re-evaluated prior to inductionOxygen Delivery Method: Circle system utilized Preoxygenation: Pre-oxygenation with 100% oxygen Intubation Type: IV induction and Cricoid Pressure applied Ventilation: Mask ventilation without difficulty Laryngoscope Size: Mac and 4 Grade View: Grade II Tube type: Oral Tube size: 7.5 mm Number of attempts: 1 Airway Equipment and Method: Stylet Placement Confirmation: ETT inserted through vocal cords under direct vision,  positive ETCO2,  CO2 detector and breath sounds checked- equal and bilateral Secured at: 22 cm Tube secured with: Tape Dental Injury: Teeth and Oropharynx as per pre-operative assessment

## 2016-07-27 NOTE — Interval H&P Note (Signed)
History and Physical Interval Note:  07/27/2016 2:12 PM  Jasmin KanarisSarah L Randolph  has presented today for surgery, with the diagnosis of splenic cyst  The various methods of treatment have been discussed with the patient and family. After consideration of risks, benefits and other options for treatment, the patient has consented to  Procedure(s): LAPAROSCOPIC  EXCISION SPLENIC CYST (N/A) as a surgical intervention .  The patient's history has been reviewed, patient examined, no change in status, stable for surgery.  I have reviewed the patient's chart and labs.  Questions were answered to the patient's satisfaction.     Nariya Neumeyer T

## 2016-07-27 NOTE — H&P (Signed)
History of Present Illness Jasmin Salina T. Henley Blyth MD; 07/07/2016 4:08 PM) The patient is a 61 year old female who presents with a splenic disorder. Patient is a pleasant 61 year old female who retired to [employer] about 9 years ago and previously resided in [place]. She still gets some of her medical care here. She about 2 months ago began to notice a vague discomfort in her left mid abdomen to left lower quadrant. She is an avid runner and fitness buff. She denies any significant pain but more just a feeling of pressure or discomfort. This is actually somewhat better and now extremely minimal over the last couple of weeks. Due to this symptom and particularly with a history of DES use by her mother she ultimately underwent a CT scan of the abdomen. I have the disc and have reviewed this and this shows a 16 x 15 x 14 cm simple appearing cyst arising from the spleen and appears to be projecting posteriorly and superiorly beneath the diaphragm and in the lesser sac with significant mass effect on upper abdominal organs including the stomach and pancreas adrenal gland kidney and bowel loops. She denies any history of previous abdominal trauma. No previous abdominal surgery. No history of GI or parasitic infection known.   Past Surgical History Timmothy Euler, [place]; 07/07/2016 2:42 PM) Colon Polyp Removal - Colonoscopy  Tonsillectomy   Diagnostic Studies History Timmothy Euler, [place]; 07/07/2016 2:42 PM) Colonoscopy  5-10 years ago Mammogram  1-3 years ago  Allergies Timmothy Euler, [place]; 07/07/2016 2:43 PM) No Known Drug Allergies 07/07/2016 Allergies Reconciled   Medication History Timmothy Euler, [place]; 07/07/2016 2:43 PM) No Current Medications Medications Reconciled  Social History Timmothy Euler, [place]; 07/07/2016 2:42 PM) Alcohol use  Occasional alcohol use. Caffeine use  Coffee. No drug use  Tobacco use  Never smoker.  Family History Timmothy Euler, [place];  07/07/2016 2:42 PM) First Degree Relatives  No pertinent family history   Pregnancy / Birth History Timmothy Euler, [place]; 07/07/2016 2:42 PM) Age at menarche  11 years. Age of menopause  20-55 Gravida  0 Para  0  Other Problems Timmothy Euler, [place]; 07/07/2016 2:42 PM) Other disease, cancer, significant illness     Review of Systems Timmothy Euler CMA; 07/07/2016 2:42 PM) Gastrointestinal Present- Abdominal Pain. Not Present- Bloating, Bloody Stool, Change in Bowel Habits, Chronic diarrhea, Constipation, Difficulty Swallowing, Excessive gas, Gets full quickly at meals, Hemorrhoids, Indigestion, Nausea, Rectal Pain and Vomiting.  Vitals Barron Alvine Bradford CMA; 07/07/2016 2:43 PM) 07/07/2016 2:43 PM Weight: 150 lb Height: 69.5in Body Surface Area: 1.84 m Body Mass Index: 21.83 kg/m  Temp.: 98.7F  Pulse: 60 (Regular)  BP: 142/88 (Sitting, Left Arm, Standard)       Physical Exam Jasmin Salina T. Katyra Tomassetti MD; 07/07/2016 4:09 PM) The physical exam findings are as follows: Note:General: Alert, thin healthy-appearing Caucasian female, in no distress Skin: Warm and dry without rash or infection. HEENT: No palpable masses or thyromegaly. Sclera nonicteric. Pupils equal round and reactive. Lymph nodes: No cervical, supraclavicular, or inguinal nodes palpable. Lungs: Breath sounds clear and equal. No wheezing or increased work of breathing. Cardiovascular: Regular rate and rhythm without murmer. No JVD or edema. Peripheral pulses intact. No carotid bruits. Abdomen: Nondistended. Soft and nontender. She is thin and with inspiration there is a palpable fullness or soft mass in the left upper quadrant. No palpable hernias. Extremities: No edema or joint swelling or deformity. No chronic venous stasis changes. Neurologic: Alert and fully oriented. Gait normal. No focal weakness.  Psychiatric: Normal mood and affect. Thought content appropriate with normal judgement and  insight    Assessment & Plan Jasmin Salina(Merrit Friesen T. 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 This likely is an epidermoid or possibly post traumatic cyst. Much less likely hidatid cyst based on appearance and history. We will check an Echinococcus serology which I expect will be negative. She has no eosinophilia. I discussed with the patient and her husband that there is very little literature regarding management of splenic cysts and no evidence based data on their management. This is a very large cyst and I think would be at some significant risk for rupture with bleeding or infection. I discussed options with the patient. Surgical options would include unroofing or marsupialization, partial splenectomy with complete excision of the cyst or total splenectomy. I would try to avoid total splenectomy if at all possible. If I were to approach this laparoscopically I would plan excision of his much cyst wall as possible and if anatomically amenable consider partial splenectomy or cauterization or ablation of the cyst wall on the spleen. I discussed that this is a very rare problem and offered a second opinion at a local Medical Center which they declined. I discussed the nature of the surgery with risks of anesthetic complications, conversion to open procedure, bleeding, infection and fairly high risk of recurrent cyst with simple marsupialization. All their questions were answered. We will check the Echinococcus serology and proceed with scheduling. Current Plans Laparoscopic and possible open excision of splenic cyst

## 2016-07-27 NOTE — Transfer of Care (Signed)
Immediate Anesthesia Transfer of Care Note  Patient: Jasmin KanarisSarah L Randolph  Procedure(s) Performed: Procedure(s): LAPAROSCOPIC  EXCISION SPLENIC CYST (N/A)  Patient Location: PACU  Anesthesia Type:General  Level of Consciousness:  sedated, patient cooperative and responds to stimulation  Airway & Oxygen Therapy:Patient Spontanous Breathing and Patient connected to face mask oxgen  Post-op Assessment:  Report given to PACU RN and Post -op Vital signs reviewed and stable  Post vital signs:  Reviewed and stable  Last Vitals:  Vitals:   07/27/16 1147  BP: 127/78  Pulse: 71  Resp: 18  Temp: 36.8 C    Complications: No apparent anesthesia complications

## 2016-07-27 NOTE — Anesthesia Postprocedure Evaluation (Signed)
Anesthesia Post Note  Patient: Jasmin KanarisSarah L Rueter  Procedure(s) Performed: Procedure(s) (LRB): LAPAROSCOPIC  EXCISION SPLENIC CYST (N/A)  Patient location during evaluation: PACU Anesthesia Type: General Level of consciousness: awake and alert, oriented and patient cooperative Pain management: pain level controlled Vital Signs Assessment: post-procedure vital signs reviewed and stable Respiratory status: spontaneous breathing, nonlabored ventilation, respiratory function stable and patient connected to nasal cannula oxygen Cardiovascular status: blood pressure returned to baseline and stable Postop Assessment: no signs of nausea or vomiting Anesthetic complications: no       Last Vitals:  Vitals:   07/27/16 1915 07/27/16 1924  BP: 129/73   Pulse: (!) 58 62  Resp: 12 17  Temp:  37.1 C    Last Pain:  Vitals:   07/27/16 1915  TempSrc:   PainSc: 2                  Shoji Pertuit,E. Peyson Postema

## 2016-07-27 NOTE — Op Note (Signed)
Preoperative Diagnosis: splenic cyst  Postoprative Diagnosis: splenic cyst  Procedure: Procedure(s): LAPAROSCOPIC  EXCISION SPLENIC CYST with partial splenectomy   Surgeon: Glenna FellowsHoxworth, Nachelle Negrette T   Assistants: Twana Firsthelsea Conner  Anesthesia:  General endotracheal anesthesia  Indications: Patient is a 61 year old female, avid runner, who recently developed left upper quadrant discomfort. CT scan was obtained showing a very large, 16 cm simple appearing cyst arising from the spleen and projecting superiorly beneath the diaphragm and posteriorly and medially into the lesser sac with significant mass effect on the upper abdominal organs including the stomach and pancreas adrenal gland and bowel loops. After extensive preoperative discussion detailed elsewhere regarding alternatives and potential complications we have elected to proceed with laparoscopic excision of her splenic cyst.    Procedure Detail:  Patient was brought to the operating room, placed in the supine position on the operating table, and general endotracheal anesthesia induced. She was carefully positioned in a semi-right lateral decubitus position with the break and the kidney rest on the table to open up the left lateral abdomen and secured carefully with beanbag and padding and tape. He received preoperative IV antibiotics. Foley catheter then placed. The left flank and abdomen were widely sterilely prepped and draped. Patient timeout was performed and the correct procedure verified. Access was obtained with a 1-1/2 cm incision at the umbilicus with a 12 mm Hassan trocar placed through a mattress suture of 0 Vicryl. Pneumoperitoneum is established. There was an obvious large mass associated with the spleen and the left upper quadrant. Additional trochars were placed, all 5 mm, in 4 additional positions extending along the left costal margin but somewhat more inferiorly due to the size of the mass. Final trocar was placed well into the left  flank after mobilization of the splenic flexure the colon. Initially exposure was obtained dividing some omental adhesions up over the spleen and to the diaphragm and mobilizing the distal transverse colon and splenic flexure down away from the spleen. We could see normal splenic tissue displaced anteriorly and medially with a large fibrous-appearing cyst wall extending off the spleen posteriorly, laterally and superiorly. The cyst was then extensively mobilized from its lateral and posterior attachments using the Harmonic scalpel. The dissection was somewhat tedious as it was difficult to manipulate due to its size. The lesser sac was opened dividing short gastric vessels and mobilizing the greater curve away from the spleen. The hilar vessels were identified medially and as we dissected the large cyst out of the retroperitoneum laterally and posteriorly hilar vessels were again identified and carefully protected.  Extensive mobilization was performed until we could see the majority of where normal-appearing splenic tissue that with white fibrous cyst wall tissue and a large part of the cyst wall was exposed. We could not expose all of the superior and posterior cyst wall due to its size and difficulty manipulating. At this point I opened the cyst with the Harmonic scalpel inferior laterally. There was clear yellowish fluid and several 100 mL was evacuated. I then opened the cyst with the Harmonic scalpel. It contained a large amount of gelatinous light green material coating the wall, possibly consistent with old hematoma. This was all suctioned. At this point we could see junction of fairly thick anterior lateral splenic tissue with the cyst wall anteriorly. I began the dissection along this plane working posteriorly to superiorly going slightly back into normal-appearing splenic tissue with Harmonic scalpel with good hemostasis. Once the cyst had been decompressed we were then also able to  more extensively  mobilized off of very dense adhesions to the diaphragm and to the posterior lateral abdominal wall and more filmy attachments posteriorly away from the pancreas and again identified the hilar vessels posteriorly. At this point we could see the entire junction of the cyst with fairly normal splenic tissue and a long dissection progressed using the Harmonic scalpel again getting just back into normal splenic tissue but where was thin and hemostasis could be obtained and the vast majority of the cyst was completely excised from the spleen. We estimated at least 80% of the cyst wall was removed.  The portion that was left was on the lateral portion of the spleen, medial portion of the cyst were we could see significant splenic vessels just beneath the cyst wall. There was also a separate very round smooth fibrous-appearing thick white plaque along the medial aspect of the spleen near the stomach separate from the cyst. This was incised and was avascular and fairly tough and we did take a incisional biopsy sent as a separate specimen. The abdomen was thoroughly irrigated. Hemostasis was assured at the operative site. There was no evidence of injury to the stomach or colon or other organ. A 19 Blake closed suction drain was left in the left upper quadrant and brought out through one of the lateral trocar sites. The large portion of cyst wall that we had excised was placed in a large Endo Catch bag and then brought up to the umbilical incision. This incision was lengthened and the cyst wall removed and sent for permanent pathology. The umbilical fascial defect was closed with several figure-of-eight 0 Vicryl sutures. The abdomen was again inspected and no problems seen. All CO2 was evacuated and trochars removed. Skin incisions were closed with subcuticular Monocryl and Dermabond. Sponge needle and instrument counts were correct.    Findings: As above  Estimated Blood Loss:  less than 50 mL         Drains: 19  Blake drain left upper quadrant  Blood Given: none          Specimens: #1 biopsy of medial splenic mass   #2 cyst wall and portion of spleen        Complications:  * No complications entered in OR log *         Disposition: PACU - hemodynamically stable.         Condition: stable

## 2016-07-28 ENCOUNTER — Encounter (HOSPITAL_COMMUNITY): Payer: Self-pay | Admitting: General Surgery

## 2016-07-28 LAB — CBC
HEMATOCRIT: 32.9 % — AB (ref 36.0–46.0)
HEMOGLOBIN: 11 g/dL — AB (ref 12.0–15.0)
MCH: 30.2 pg (ref 26.0–34.0)
MCHC: 33.4 g/dL (ref 30.0–36.0)
MCV: 90.4 fL (ref 78.0–100.0)
Platelets: 164 10*3/uL (ref 150–400)
RBC: 3.64 MIL/uL — ABNORMAL LOW (ref 3.87–5.11)
RDW: 13.2 % (ref 11.5–15.5)
WBC: 10.1 10*3/uL (ref 4.0–10.5)

## 2016-07-28 LAB — BASIC METABOLIC PANEL
Anion gap: 3 — ABNORMAL LOW (ref 5–15)
BUN: 11 mg/dL (ref 6–20)
CHLORIDE: 104 mmol/L (ref 101–111)
CO2: 28 mmol/L (ref 22–32)
Calcium: 8.6 mg/dL — ABNORMAL LOW (ref 8.9–10.3)
Creatinine, Ser: 0.73 mg/dL (ref 0.44–1.00)
GFR calc Af Amer: >60 mL/min (ref >60)
GFR calc non Af Amer: >60 mL/min (ref >60)
Glucose, Bld: 134 mg/dL — ABNORMAL HIGH (ref 65–99)
Potassium: 4.3 mmol/L (ref 3.5–5.1)
Sodium: 135 mmol/L (ref 135–145)

## 2016-07-28 MED ORDER — PANTOPRAZOLE SODIUM 40 MG PO TBEC
40.0000 mg | DELAYED_RELEASE_TABLET | Freq: Every day | ORAL | Status: DC
  Administered 2016-07-28: 40 mg via ORAL
  Filled 2016-07-28: qty 1

## 2016-07-28 NOTE — Progress Notes (Signed)
1 Day Post-Op   Subjective: No complaints this morning. Denies pain or nausea. Mild soreness. Tolerating clear liquids. He has been up walking in the halls.  Objective: Vital signs in last 24 hours: Temp:  [97.6 F (36.4 C)-99 F (37.2 C)] 97.6 F (36.4 C) (03/14 0516) Pulse Rate:  [58-71] 71 (03/14 0516) Resp:  [11-18] 18 (03/14 0516) BP: (112-151)/(64-93) 114/88 (03/14 0516) SpO2:  [99 %-100 %] 99 % (03/14 0516) Weight:  [68.5 kg (151 lb)] 68.5 kg (151 lb) (03/13 1147) Last BM Date: 07/27/16  Intake/Output from previous day: 03/13 0701 - 03/14 0700 In: 3675 [P.O.:480; I.V.:3195] Out: 1665 [Urine:1330; Drains:285; Blood:50] Intake/Output this shift: No intake/output data recorded.  General appearance: alert, cooperative and no distress Resp: No wheezing or increased work of breathing GI: normal findings: soft, non-tender and Nondistended Incision/Wound: No erythema or drainage. JP drainage serosanguineous and less bloody  Lab Results:   Recent Labs  07/26/16 1500 07/28/16 0504  WBC 4.8 10.1  HGB 12.9 11.0*  HCT 38.1 32.9*  PLT 189 164   BMET  Recent Labs  07/28/16 0504  NA 135  K 4.3  CL 104  CO2 28  GLUCOSE 134*  BUN 11  CREATININE 0.73  CALCIUM 8.6*     Studies/Results: No results found.  Anti-infectives: Anti-infectives    Start     Dose/Rate Route Frequency Ordered Stop   07/27/16 1122  ceFAZolin (ANCEF) IVPB 2g/100 mL premix     2 g 200 mL/hr over 30 Minutes Intravenous On call to O.R. 07/27/16 1122 07/27/16 1830      Assessment/Plan: s/p Procedure(s): LAPAROSCOPIC  EXCISION SPLENIC CYST Doing well postoperatively. Advance diet. Observe for bleeding 24 hours with possible discharge tomorrow.    LOS: 1 day    Loring Liskey T 3/14/2018Patient ID: Jasmin KanarisSarah L Randolph, female   DOB: Mar 24, 1956, 61 y.o.   MRN: [id number]

## 2016-07-28 NOTE — Progress Notes (Signed)

## 2016-07-29 LAB — CBC
HCT: 34 % — ABNORMAL LOW (ref 36.0–46.0)
Hemoglobin: 11.3 g/dL — ABNORMAL LOW (ref 12.0–15.0)
MCH: 30.3 pg (ref 26.0–34.0)
MCHC: 33.2 g/dL (ref 30.0–36.0)
MCV: 91.2 fL (ref 78.0–100.0)
PLATELETS: 135 10*3/uL — AB (ref 150–400)
RBC: 3.73 MIL/uL — ABNORMAL LOW (ref 3.87–5.11)
RDW: 13.5 % (ref 11.5–15.5)
WBC: 7.1 10*3/uL (ref 4.0–10.5)

## 2016-07-29 LAB — BASIC METABOLIC PANEL
Anion gap: 4 — ABNORMAL LOW (ref 5–15)
BUN: 6 mg/dL (ref 6–20)
CHLORIDE: 108 mmol/L (ref 101–111)
CO2: 29 mmol/L (ref 22–32)
CREATININE: 0.6 mg/dL (ref 0.44–1.00)
Calcium: 9.1 mg/dL (ref 8.9–10.3)
GFR calc Af Amer: >60 mL/min (ref >60)
GFR calc non Af Amer: >60 mL/min (ref >60)
GLUCOSE: 121 mg/dL — AB (ref 65–99)
POTASSIUM: 4.1 mmol/L (ref 3.5–5.1)
SODIUM: 141 mmol/L (ref 135–145)

## 2016-07-29 MED ORDER — OXYCODONE-ACETAMINOPHEN 5-325 MG PO TABS: 1.0000 | ORAL_TABLET | ORAL | 0 refills | Status: AC | PRN

## 2016-07-29 NOTE — Discharge Summary (Signed)
  Patient ID: Jasmin KanarisSarah L Forgy [id number] 61 y.o. 01-28-1956  07/27/2016  Discharge date and time: 07/29/2016   Admitting Physician: Glenna FellowsHOXWORTH,Nicolemarie Wooley T  Discharge Physician: Glenna FellowsHOXWORTH,Ithan Touhey T  Admission Diagnoses: splenic cyst  Discharge Diagnoses: Same  Operations: Procedure(s): LAPAROSCOPIC  EXCISION SPLENIC CYST  Admission Condition: good  Discharged Condition: good  Indication for Admission: Patient is a 61 year old female who presented with left upper quadrant discomfort and on CT scan was found to have a very large, approximately 16 cm simple-appearing splenic cyst occupying a large portion of the left upper quadrant of her abdomen. After extensive preoperative discussion regarding options and surgical risks detailed elsewhere C is electively admitted for laparoscopic excision of her splenic cyst.  Hospital Course: On the day of admission patient underwent an uneventful laparoscopic excision of a large splenic cyst. The spleen was preserved and approximately 80% of the cyst wall was excised. Her postoperative course was unremarkable. A JP drain was left in place. Drainage gradually became serosanguineous. She had minimal pain and was advanced to a regular diet without difficulty. CBC showed minimal drop in her hemoglobin. She was felt ready for discharge on the second postoperative day.    Disposition: Home  Patient Instructions:  Allergies as of 07/29/2016   No Known Allergies     Medication List    TAKE these medications   b complex vitamins tablet Take 1 tablet by mouth daily.   FISH OIL PO Take 1 capsule by mouth daily.   FOLIC ACID PO Take 1 tablet by mouth daily.   LYSINE PO Take 1 capsule by mouth daily.   oxyCODONE-acetaminophen 5-325 MG tablet Commonly known as:  PERCOCET/ROXICET Take 1-2 tablets by mouth every 4 (four) hours as needed for moderate pain.   valACYclovir 500 MG tablet Commonly known as:  VALTREX Take 250 mg by mouth daily as needed  (fever blisters).   Zinc 25 MG Tabs Take 25 mg by mouth daily.       Activity: No Strenuous exercise for 3 weeks Diet: regular diet Wound Care: Empty JP drain twice daily and record amount  Follow-up:  With Dr. Johna SheriffHoxworth in 1 week.  Signed: Mariella SaaBenjamin T Atira Borello MD, FACS  07/29/2016, 8:32 AM

## 2016-07-29 NOTE — Discharge Instructions (Signed)
[facility] ______[facility] °LAPAROSCOPIC SURGERY: POST OP INSTRUCTIONS °Always review your discharge instruction sheet given to you by the facility where your surgery was performed. °IF YOU HAVE DISABILITY OR FAMILY LEAVE FORMS, YOU MUST BRING THEM TO THE OFFICE FOR PROCESSING.   °DO NOT GIVE THEM TO YOUR DOCTOR. ° °1. A prescription for pain medication may be given to you upon discharge.  Take your pain medication as prescribed, if needed.  If narcotic pain medicine is not needed, then you may take acetaminophen (Tylenol) or ibuprofen (Advil) as needed. °2. Take your usually prescribed medications unless otherwise directed. °3. If you need a refill on your pain medication, please contact your pharmacy.  They will contact our office to request authorization. Prescriptions will not be filled after 5pm or on week-ends. °4. You should follow a light diet the first few days after arrival home, such as soup and crackers, etc.  Be sure to include lots of fluids daily. °5. Most patients will experience some swelling and bruising in the area of the incisions.  Ice packs will help.  Swelling and bruising can take several days to resolve.  °6. It is common to experience some constipation if taking pain medication after surgery.  Increasing fluid intake and taking a stool softener (such as Colace) will usually help or prevent this problem from occurring.  A mild laxative (Milk of Magnesia or Miralax) should be taken according to package instructions if there are no bowel movements after 48 hours. °7. Unless discharge instructions indicate otherwise, you may remove your bandages 24-48 hours after surgery, and you may shower at that time.  You may have steri-strips (small skin tapes) in place directly over the incision.  These strips should be left on the skin for 7-10 days.  If your surgeon used skin glue on the incision, you may shower in 24 hours.  The glue will flake off over the next 2-3 weeks.  Any sutures or  staples will be removed at the office during your follow-up visit. °8. ACTIVITIES:  You may resume regular (light) daily activities beginning the next day--such as daily self-care, walking, climbing stairs--gradually increasing activities as tolerated.  You may have sexual intercourse when it is comfortable.  Refrain from any heavy lifting or straining until approved by your doctor. °a. You may drive when you are no longer taking prescription pain medication, you can comfortably wear a seatbelt, and you can safely maneuver your car and apply brakes. °b. RETURN TO WORK:  __________________________________________________________ °9. You should see your doctor in the office for a follow-up appointment approximately 2-3 weeks after your surgery.  Make sure that you call for this appointment within a day or two after you arrive home to insure a convenient appointment time. °10. OTHER INSTRUCTIONS: __________________________________________________________________________________________________________________________ __________________________________________________________________________________________________________________________ °WHEN TO CALL YOUR DOCTOR: °1. Fever over 101.0 °2. Inability to urinate °3. Continued bleeding from incision. °4. Increased pain, redness, or drainage from the incision. °5. Increasing abdominal pain ° °The clinic staff is available to answer your questions during regular business hours.  Please don’t hesitate to call and ask to speak to one of the nurses for clinical concerns.  If you have a medical emergency, go to the nearest emergency room or call 911.  A surgeon from [facility] is always on call at the hospital. °[address], [address], [place], [place]  [address] ? [address] [address], [place], [place]   [address] °[phone] ? [phone] ? FAX [phone] °Web site:   [email or web address] °

## 2016-07-29 NOTE — Progress Notes (Signed)
Pt was ambulating and eating with no complaints.  The patient had showered and the drain dressing was changed. No drainage noted. D/C instructions and a prescription was given. She verbalized understanding of the instructions.

## 2016-07-29 NOTE — Progress Notes (Signed)
Patient ID: Jasmin KanarisSarah L Witman, female   DOB: Jul 12, 1955, 61 y.o.   MRN: [id number] 2 Days Post-Op   Subjective: No complaints. Minimal pain. Up walking in the hall. Tolerating diet.  Objective: Vital signs in last 24 hours: Temp:  [98.5 F (36.9 C)-98.9 F (37.2 C)] 98.7 F (37.1 C) (03/15 0552) Pulse Rate:  [56-60] 57 (03/15 0552) Resp:  [16] 16 (03/15 0552) BP: (127-129)/(68-76) 129/76 (03/15 0552) SpO2:  [98 %-100 %] 99 % (03/15 0552) Last BM Date: 07/27/16  Intake/Output from previous day: 03/14 0701 - 03/15 0700 In: 4157.9 [P.O.:1980; I.V.:2177.9] Out: 6976 [Urine:6650; Drains:326] Intake/Output this shift: No intake/output data recorded.  General appearance: alert, cooperative and no distress Resp: clear to auscultation bilaterally GI: normal findings: soft, non-tender Incision/Wound: Clean and dry. JP serosanguineous.  Lab Results:   Recent Labs  07/28/16 0504 07/29/16 0457  WBC 10.1 7.1  HGB 11.0* 11.3*  HCT 32.9* 34.0*  PLT 164 135*   BMET  Recent Labs  07/28/16 0504 07/29/16 0457  NA 135 141  K 4.3 4.1  CL 104 108  CO2 28 29  GLUCOSE 134* 121*  BUN 11 6  CREATININE 0.73 0.60  CALCIUM 8.6* 9.1     Studies/Results: No results found.  Anti-infectives: Anti-infectives    Start     Dose/Rate Route Frequency Ordered Stop   07/27/16 1122  ceFAZolin (ANCEF) IVPB 2g/100 mL premix     2 g 200 mL/hr over 30 Minutes Intravenous On call to O.R. 07/27/16 1122 07/27/16 1830      Assessment/Plan: s/p Procedure(s): LAPAROSCOPIC  EXCISION SPLENIC CYST Doing well postoperatively without apparent complication. Okay for discharge.   LOS: 2 days    Verna Desrocher T 07/29/2016
